# Patient Record
Sex: Female | Born: 1955 | Race: White | Hispanic: No | Marital: Married | Smoking: Never smoker
Health system: Southern US, Community
[De-identification: ages and names within clinical notes are randomized; demographics above are authoritative.]

## PROBLEM LIST (undated history)

## (undated) DIAGNOSIS — G43909 Migraine, unspecified, not intractable, without status migrainosus: Secondary | ICD-10-CM

## (undated) DIAGNOSIS — K219 Gastro-esophageal reflux disease without esophagitis: Secondary | ICD-10-CM

## (undated) HISTORY — DX: Gastro-esophageal reflux disease without esophagitis: K21.9

## (undated) HISTORY — PX: BREAST EXCISIONAL BIOPSY: SUR124

## (undated) HISTORY — DX: Migraine, unspecified, not intractable, without status migrainosus: G43.909

---

## 1991-06-17 HISTORY — PX: KNEE SURGERY: SHX244

## 1998-06-16 HISTORY — PX: LAMINECTOMY: SHX219

## 2013-07-26 ENCOUNTER — Other Ambulatory Visit: Payer: Self-pay | Admitting: *Deleted

## 2013-07-26 DIAGNOSIS — N649 Disorder of breast, unspecified: Secondary | ICD-10-CM

## 2013-08-03 ENCOUNTER — Other Ambulatory Visit: Payer: Self-pay | Admitting: *Deleted

## 2013-08-12 ENCOUNTER — Other Ambulatory Visit: Payer: Self-pay | Admitting: Unknown Physician Specialty

## 2013-08-12 ENCOUNTER — Other Ambulatory Visit: Payer: Self-pay | Admitting: *Deleted

## 2013-08-12 ENCOUNTER — Ambulatory Visit
Admission: RE | Admit: 2013-08-12 | Discharge: 2013-08-12 | Disposition: A | Discharge status: Home / Self Care | Payer: BC Managed Care – PPO | Source: Ambulatory Visit | Attending: *Deleted | Admitting: *Deleted

## 2013-08-12 DIAGNOSIS — N649 Disorder of breast, unspecified: Secondary | ICD-10-CM

## 2013-08-12 HISTORY — PX: BREAST BIOPSY: SHX20

## 2013-09-26 ENCOUNTER — Other Ambulatory Visit: Payer: Self-pay | Admitting: Unknown Physician Specialty

## 2013-09-26 DIAGNOSIS — N649 Disorder of breast, unspecified: Secondary | ICD-10-CM

## 2014-02-23 ENCOUNTER — Other Ambulatory Visit: Payer: Self-pay | Admitting: Unknown Physician Specialty

## 2014-02-23 DIAGNOSIS — N6310 Unspecified lump in the right breast, unspecified quadrant: Secondary | ICD-10-CM

## 2014-03-10 ENCOUNTER — Ambulatory Visit
Admission: RE | Admit: 2014-03-10 | Discharge: 2014-03-10 | Disposition: A | Discharge status: Home / Self Care | Payer: BC Managed Care – PPO | Source: Ambulatory Visit | Attending: Unknown Physician Specialty | Admitting: Unknown Physician Specialty

## 2014-03-10 DIAGNOSIS — N6310 Unspecified lump in the right breast, unspecified quadrant: Secondary | ICD-10-CM

## 2014-03-20 ENCOUNTER — Other Ambulatory Visit: Payer: BC Managed Care – PPO

## 2014-07-28 ENCOUNTER — Other Ambulatory Visit: Payer: Self-pay

## 2014-07-28 DIAGNOSIS — Z1231 Encounter for screening mammogram for malignant neoplasm of breast: Secondary | ICD-10-CM

## 2014-08-04 ENCOUNTER — Ambulatory Visit
Admission: RE | Admit: 2014-08-04 | Discharge: 2014-08-04 | Disposition: A | Discharge status: Home / Self Care | Payer: BLUE CROSS/BLUE SHIELD | Source: Ambulatory Visit

## 2014-08-04 DIAGNOSIS — Z1231 Encounter for screening mammogram for malignant neoplasm of breast: Secondary | ICD-10-CM

## 2015-01-29 IMAGING — MG MM DIAGNOSTIC UNILATERAL R
3 series · 3 of 3 positions shown · non-contrast
Comparison: Previous exams.

CLINICAL DATA: Right breast mass. Status post ultrasound-guided
core biopsy.

EXAM:
POST-BIOPSY CLIP PLACEMENT RIGHT DIAGNOSTIC MAMMOGRAM

[R CC (1 of 2)]
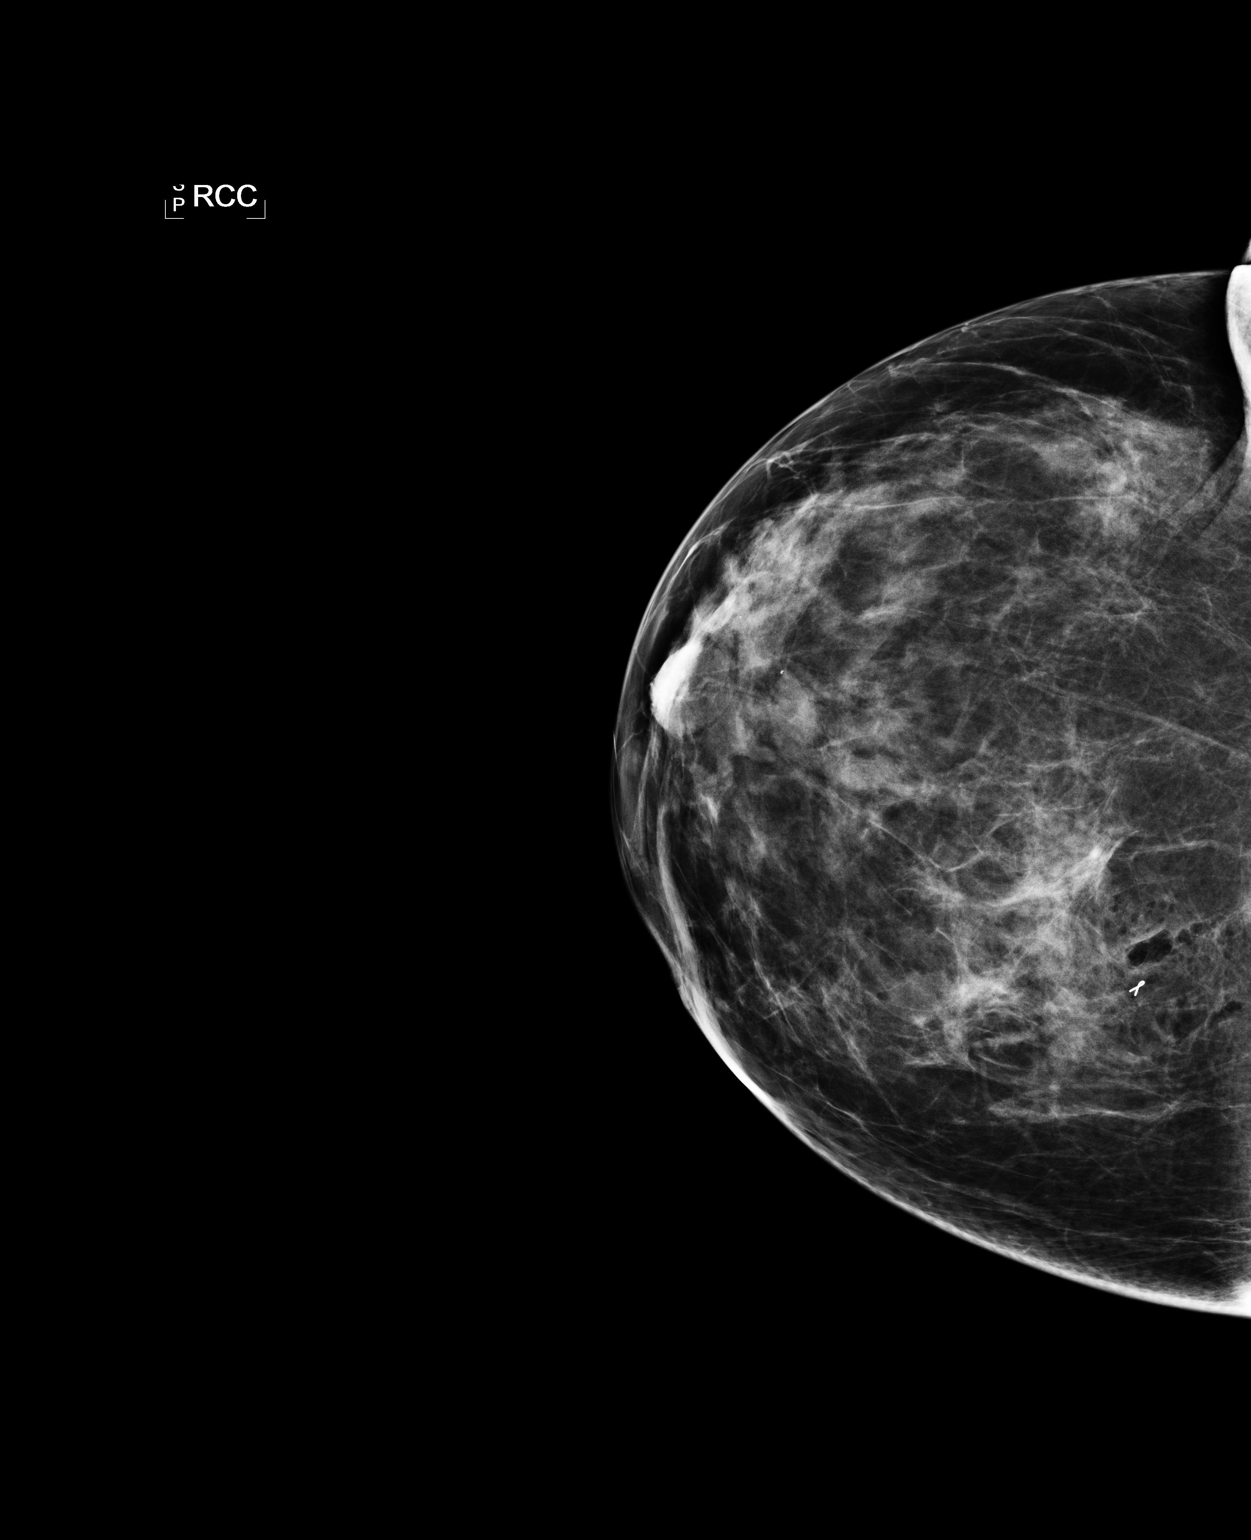

[R ML]
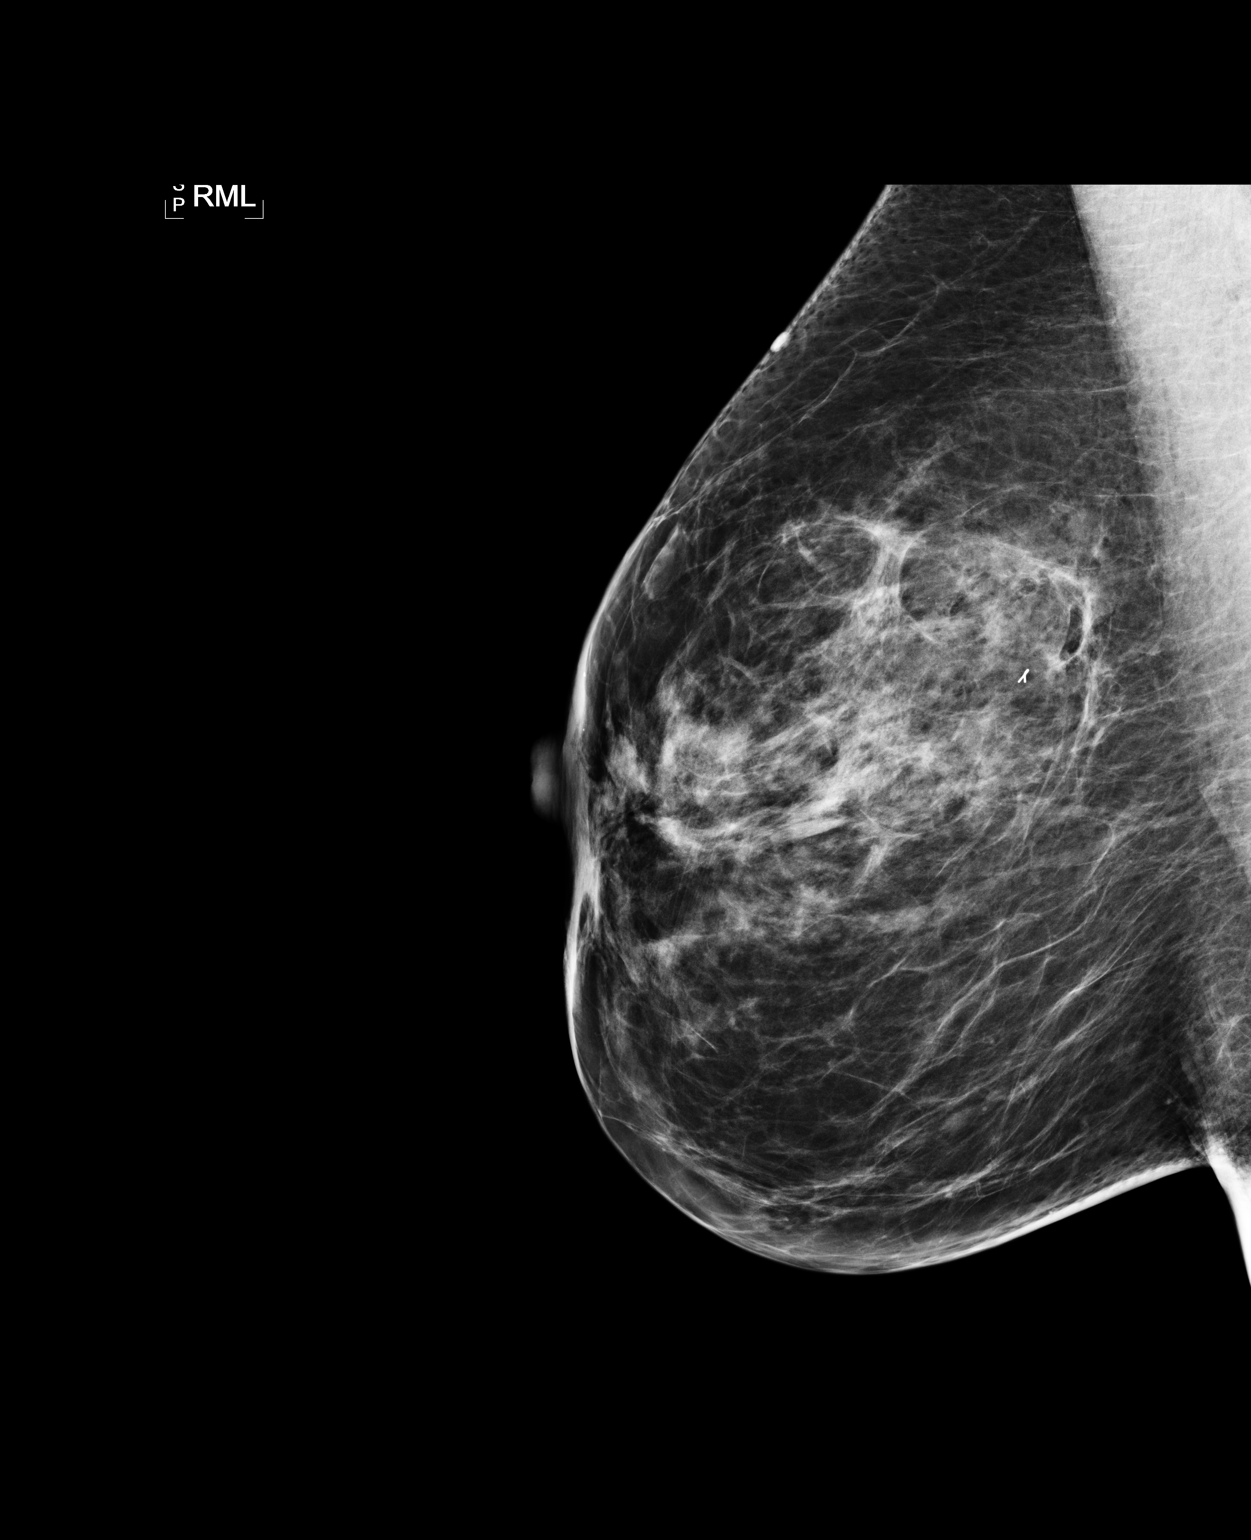

[R CC (2 of 2)]
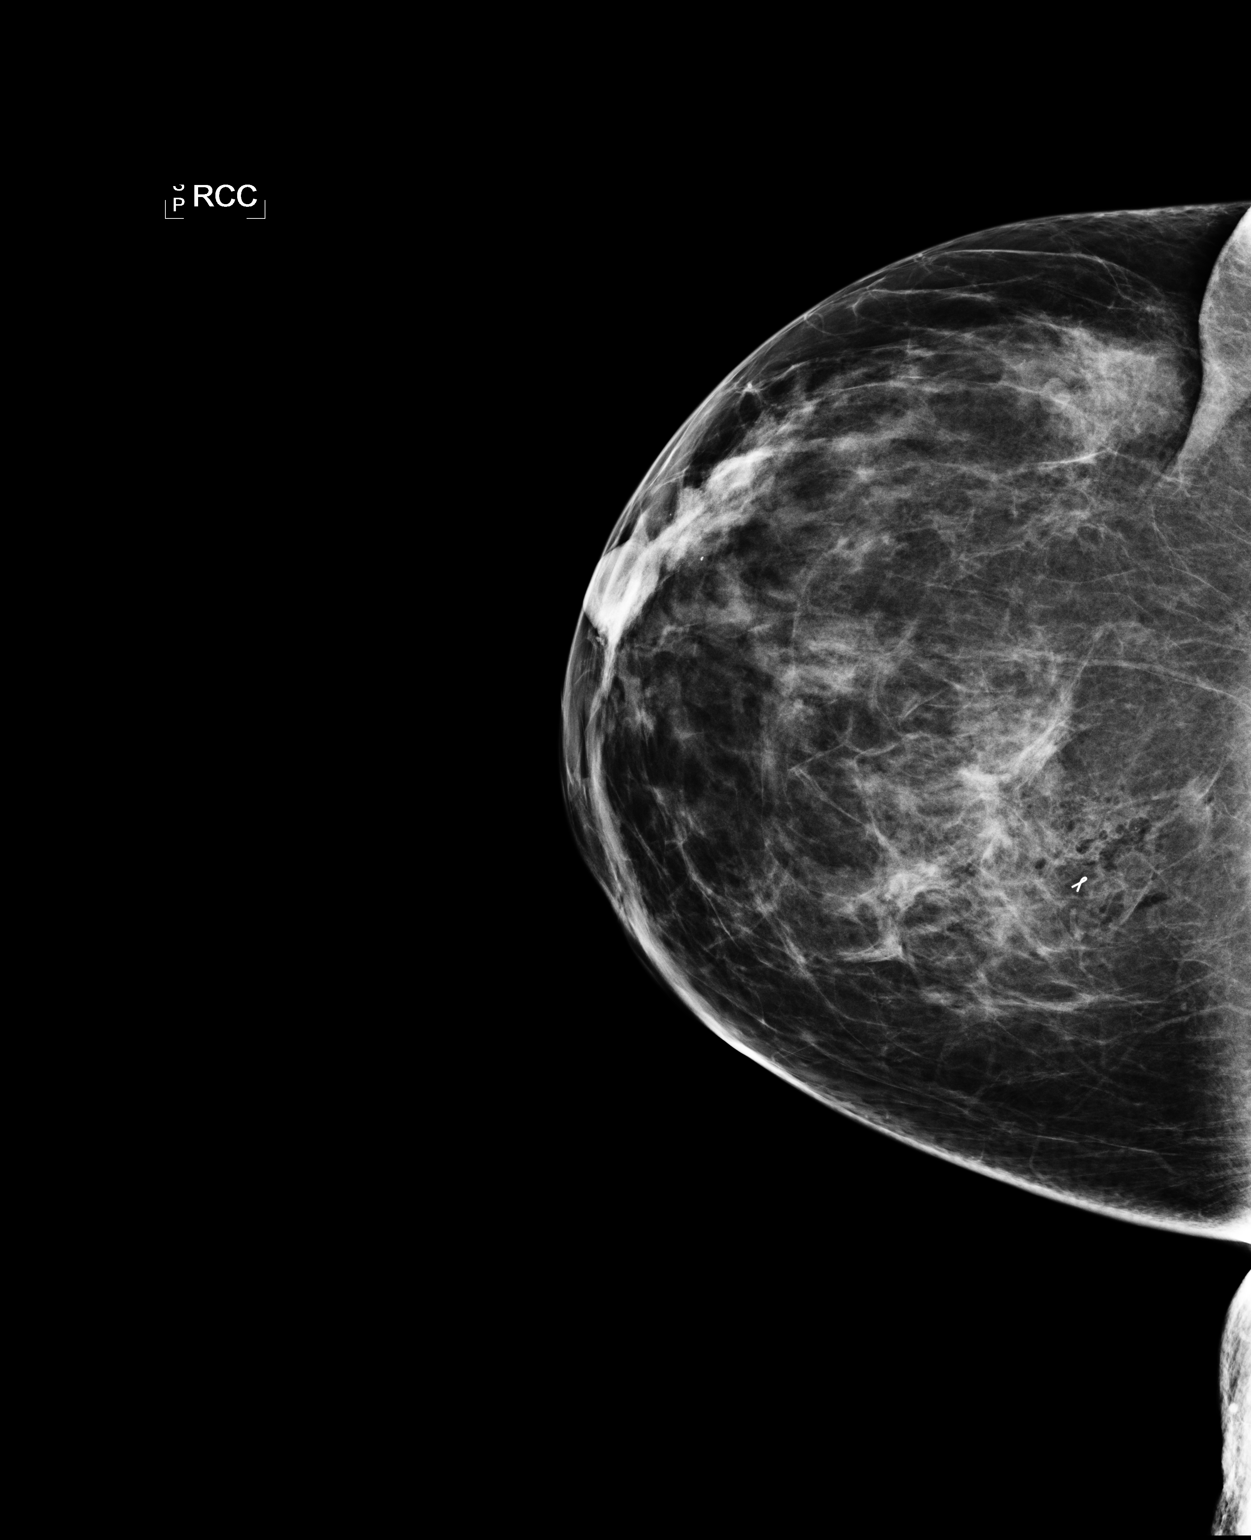

[3 of 3 positions shown; findings below may reference images not displayed]

FINDINGS: Films are performed following ultrasound guided biopsy of a mass in
the ![DATE] location of the right breast. Mammographic images showed
there is a ribbon shaped clip located approximately 2.5 cm anterior
medial to the biopsied lesion.
IMPRESSION: Status post ultrasound-guided core biopsy of right breast mass with
pathology pending.

Final Assessment: Post Procedure Mammograms for Marker Placement

## 2015-08-17 ENCOUNTER — Other Ambulatory Visit: Payer: Self-pay

## 2015-08-17 DIAGNOSIS — Z1231 Encounter for screening mammogram for malignant neoplasm of breast: Secondary | ICD-10-CM

## 2015-08-27 IMAGING — MG MM DIAGNOSTIC UNILATERAL R
4 series · 4 of 4 positions shown · non-contrast
Comparison: 07/19/2013 and earlier

CLINICAL DATA: The patient returns for six-month follow-up after
benign right breast biopsy.

EXAM:
DIGITAL DIAGNOSTIC  RIGHT MAMMOGRAM WITH CAD
ULTRASOUND RIGHT BREAST

[R CC (1 of 2)]
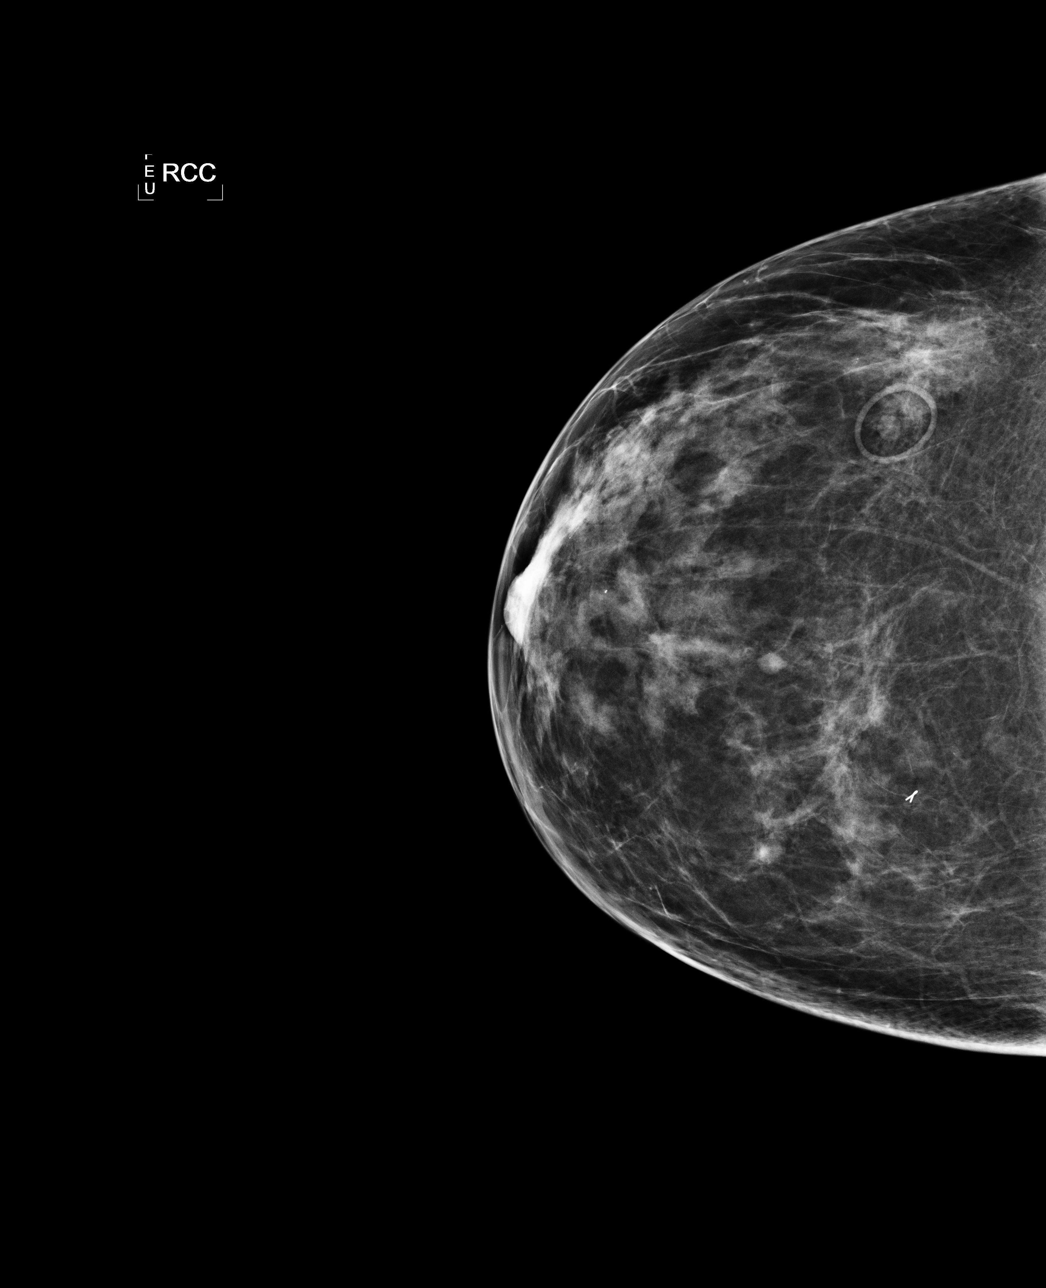

[R MLO]
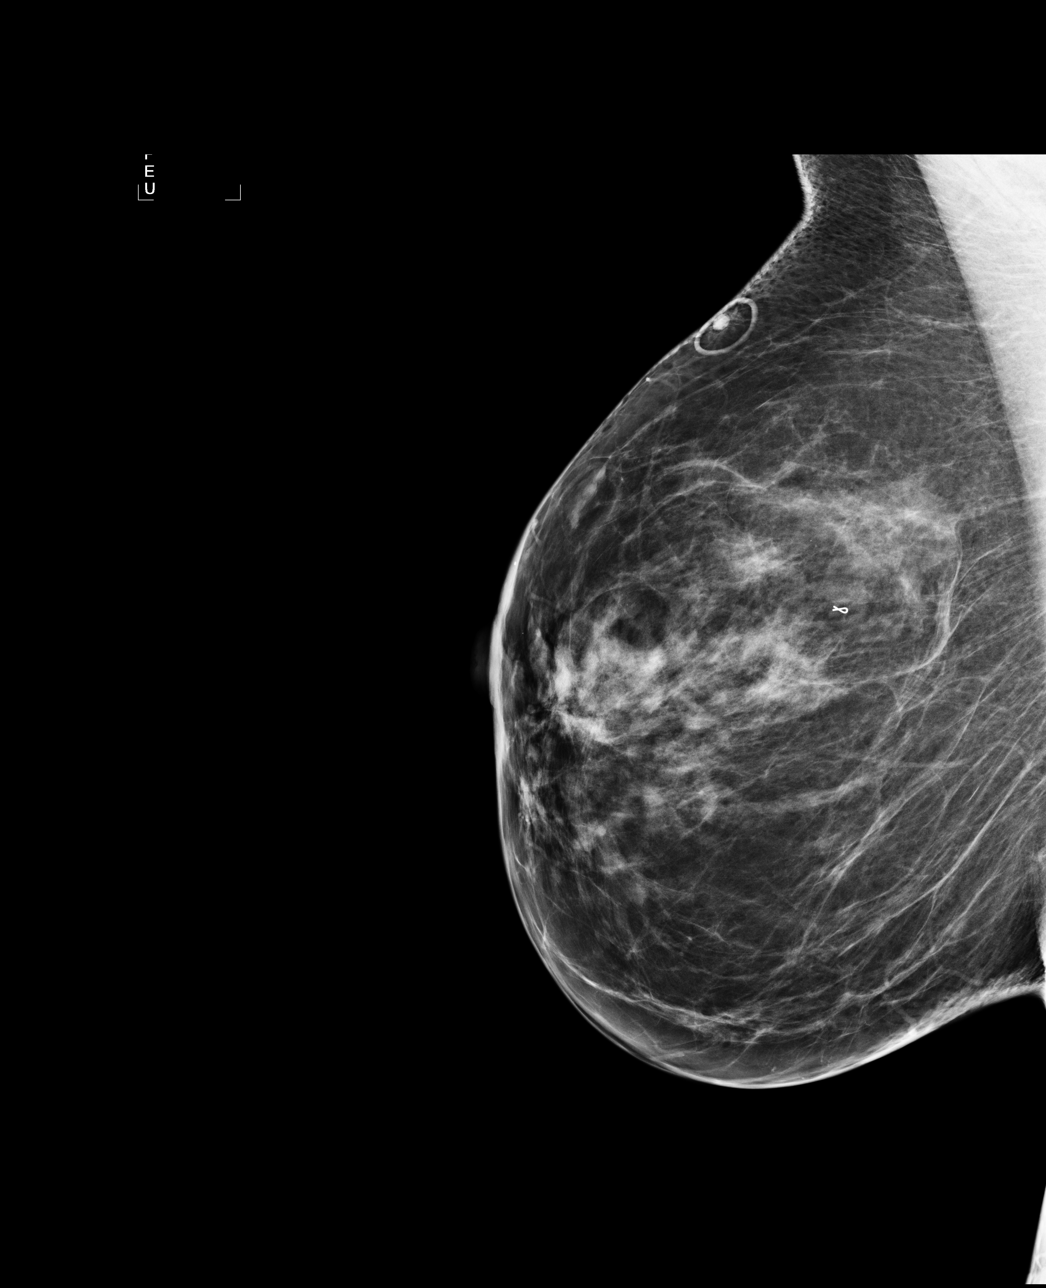

[R ML]
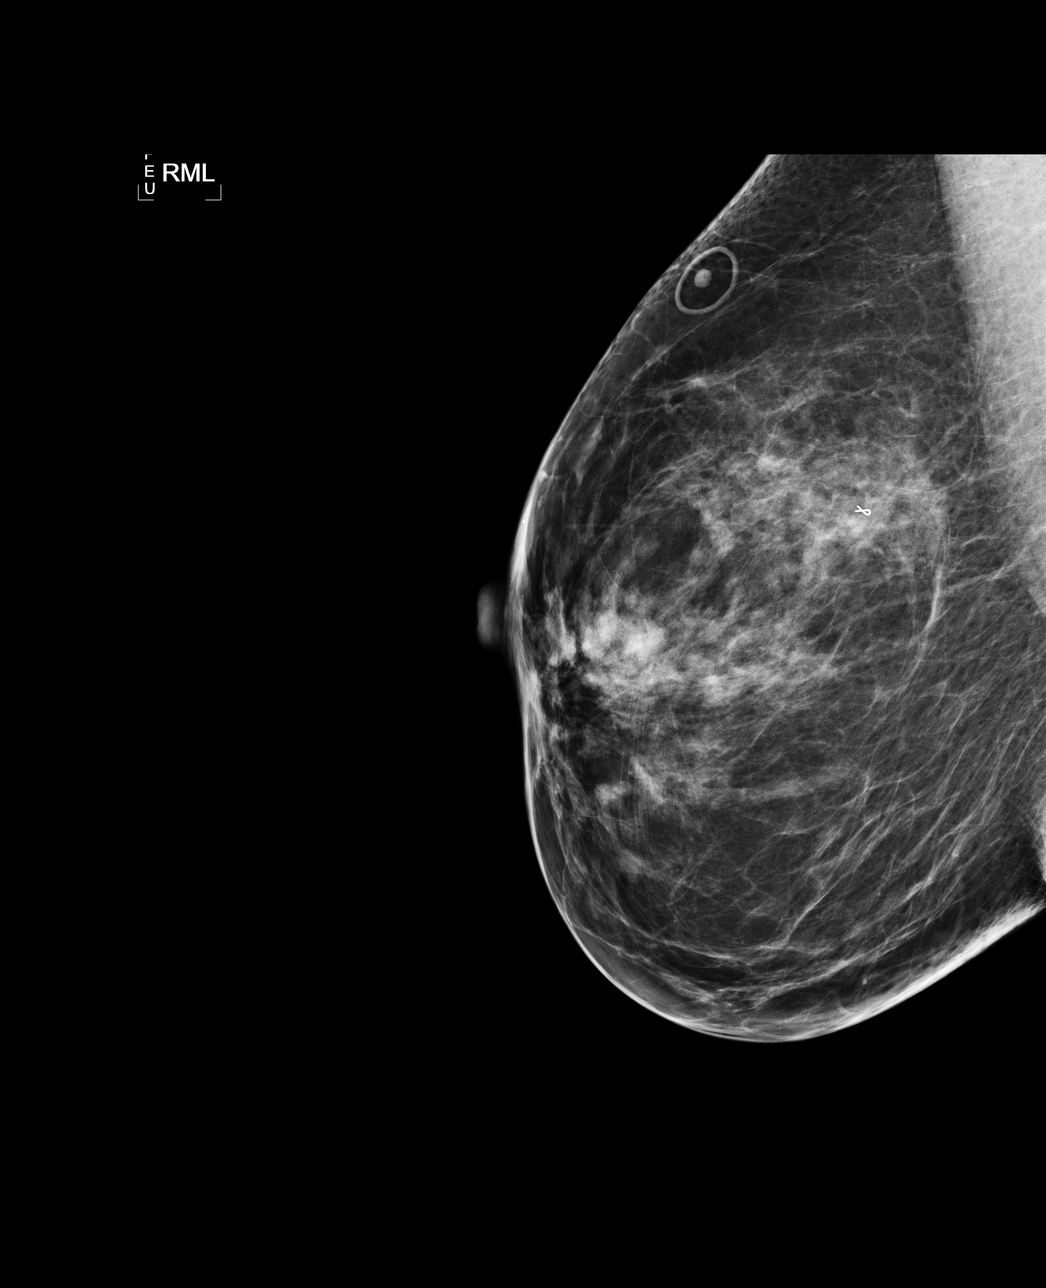

[R CC (2 of 2)]
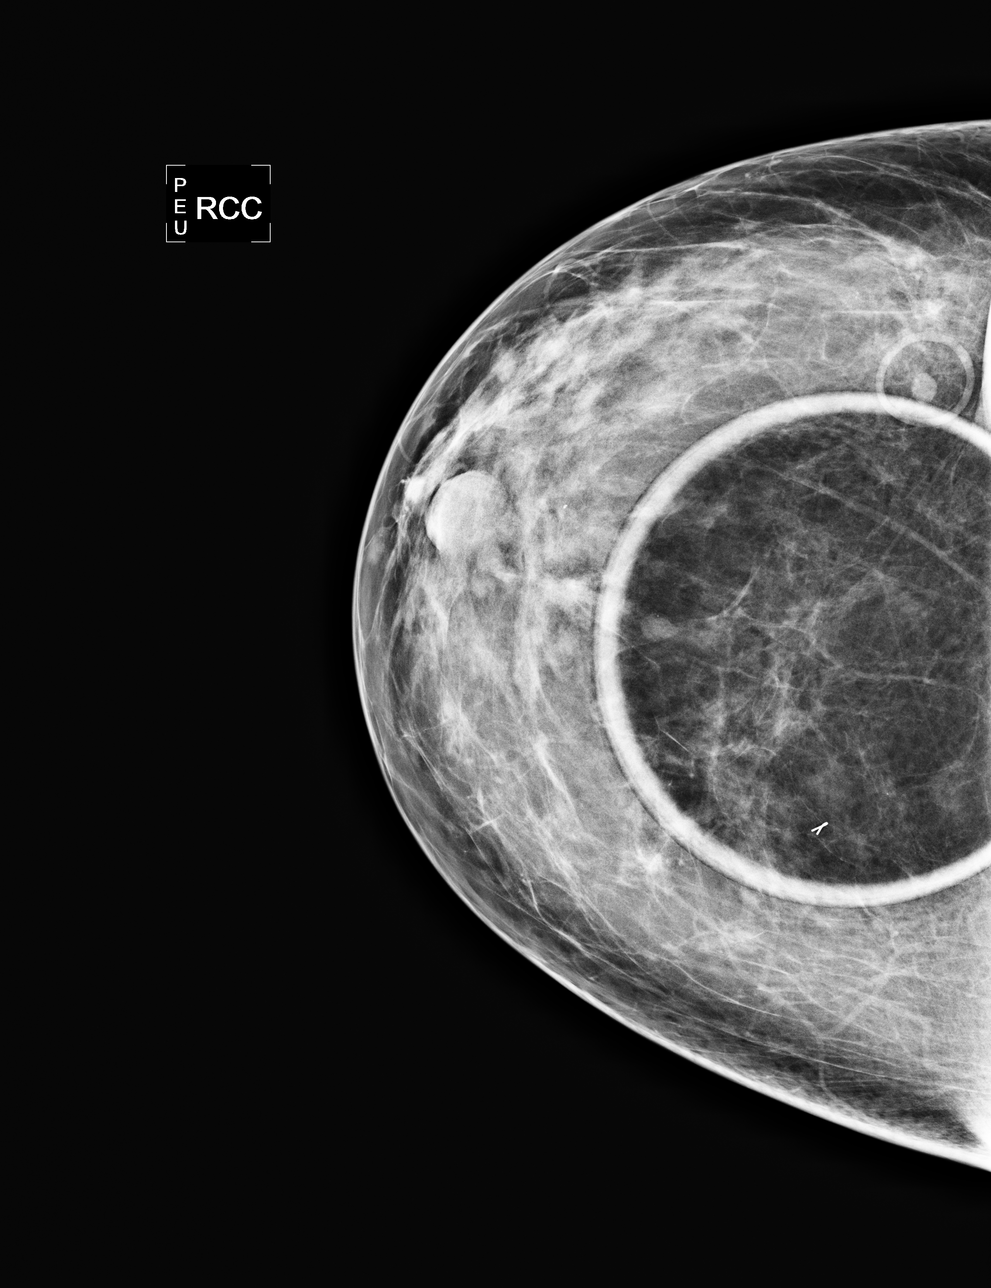

[4 of 4 positions shown; findings below may reference images not displayed]

ACR Breast Density Category c: The breast tissue is heterogeneously
dense, which may obscure small masses.
FINDINGS: Small nodule previously noted in the upper inner quadrant of the
right breast is no longer apparent following biopsy. A new oval
circumscribed nodule is identified in the central portion of the
right breast on CC view but not demonstrated on the oblique views.
Further evaluation is also performed with ultrasound.

Mammographic images were processed with CAD.

On physical exam, I palpate no abnormality in the central portions
of the right breast.

Ultrasound is performed, showing retroareolar duct ectasia. No
suspicious mass or distortion identified sonographically in the
upper or lower central portions of the right breast.
IMPRESSION: 1. Expected post biopsy changes in the right breast.
2. Retroareolar duct ectasia.

RECOMMENDATION:
Screening mammogram is recommended in July 2014.

I have discussed the findings and recommendations with the patient.
Results were also provided in writing at the conclusion of the
visit. If applicable, a reminder letter will be sent to the patient
regarding the next appointment.

BI-RADS CATEGORY  2: Benign.

## 2015-09-04 ENCOUNTER — Ambulatory Visit
Admission: RE | Admit: 2015-09-04 | Discharge: 2015-09-04 | Disposition: A | Discharge status: Home / Self Care | Payer: PRIVATE HEALTH INSURANCE | Source: Ambulatory Visit

## 2015-09-04 DIAGNOSIS — Z1231 Encounter for screening mammogram for malignant neoplasm of breast: Secondary | ICD-10-CM

## 2016-09-18 ENCOUNTER — Other Ambulatory Visit: Payer: Self-pay | Admitting: Unknown Physician Specialty

## 2016-09-18 DIAGNOSIS — Z1231 Encounter for screening mammogram for malignant neoplasm of breast: Secondary | ICD-10-CM

## 2016-10-28 ENCOUNTER — Ambulatory Visit
Admission: RE | Admit: 2016-10-28 | Discharge: 2016-10-28 | Disposition: A | Discharge status: Home / Self Care | Payer: PRIVATE HEALTH INSURANCE | Source: Ambulatory Visit | Attending: Unknown Physician Specialty | Admitting: Unknown Physician Specialty

## 2016-10-28 DIAGNOSIS — Z1231 Encounter for screening mammogram for malignant neoplasm of breast: Secondary | ICD-10-CM

## 2017-07-27 ENCOUNTER — Ambulatory Visit (INDEPENDENT_AMBULATORY_CARE_PROVIDER_SITE_OTHER): Payer: PRIVATE HEALTH INSURANCE | Admitting: Orthopaedic Surgery

## 2017-07-27 ENCOUNTER — Ambulatory Visit (INDEPENDENT_AMBULATORY_CARE_PROVIDER_SITE_OTHER): Payer: PRIVATE HEALTH INSURANCE

## 2017-07-27 ENCOUNTER — Encounter (INDEPENDENT_AMBULATORY_CARE_PROVIDER_SITE_OTHER): Payer: Self-pay | Admitting: Orthopaedic Surgery

## 2017-07-27 VITALS — Ht 63.0 in | Wt 150.0 lb

## 2017-07-27 DIAGNOSIS — G8929 Other chronic pain: Secondary | ICD-10-CM | POA: Diagnosis not present

## 2017-07-27 DIAGNOSIS — M545 Low back pain: Secondary | ICD-10-CM

## 2017-07-27 DIAGNOSIS — M25551 Pain in right hip: Secondary | ICD-10-CM | POA: Diagnosis not present

## 2017-07-27 MED ORDER — METHYLPREDNISOLONE ACETATE 40 MG/ML IJ SUSP
40.0000 mg | INTRAMUSCULAR | Status: AC | PRN
  Administered 2017-07-27: 40 mg via INTRA_ARTICULAR

## 2017-07-27 MED ORDER — BUPIVACAINE HCL 0.5 % IJ SOLN
3.0000 mL | INTRAMUSCULAR | Status: AC | PRN
  Administered 2017-07-27: 3 mL via INTRA_ARTICULAR

## 2017-07-27 MED ORDER — LIDOCAINE HCL 1 % IJ SOLN
3.0000 mL | INTRAMUSCULAR | Status: AC | PRN
  Administered 2017-07-27: 3 mL

## 2017-07-27 NOTE — Progress Notes (Signed)
Office Visit Note   Patient: Jenna GordonRobin Mahnken           Date of Birth: 07/16/1955           MRN: [id number] Visit Date: 07/27/2017              Requested by: No referring provider defined for this encounter. PCP: Romeo Rabonaplan, Michael, MD   Assessment & Plan: Visit Diagnoses:  1. Chronic right-sided low back pain, with sciatica presence unspecified   2. Pain in right hip     Plan: Impression is 62 year old female with right lateral hip pain.  Trochanteric bursitis injection was performed today.  Patient tolerates well.  IT band stretches were given today.  If she is not better may need to consider MRI to rule out structural abnormalities.  Otherwise follow-up as needed.  Questions encouraged and answered.  Follow-Up Instructions: Return if symptoms worsen or fail to improve.   Orders:  Orders Placed This Encounter  Procedures  . XR HIP UNILAT W OR W/O PELVIS 2-3 VIEWS RIGHT  . XR Lumbar Spine 2-3 Views   No orders of the defined types were placed in this encounter.     Procedures: Large Joint Inj: R greater trochanter on 07/27/2017 4:47 PM Indications: pain Details: 22 G needle  Arthrogram: No  Medications: 3 mL lidocaine 1 %; 3 mL bupivacaine 0.5 %; 40 mg methylPREDNISolone acetate 40 MG/ML Patient was prepped and draped in the usual sterile fashion.       Clinical Data: No additional findings.   Subjective: Chief Complaint  Patient presents with  . Lower Back - Pain  . Right Hip - Pain    Patient is a very pleasant 62 year old female comes in with right hip pain in the posterior lateral region.  Denies any groin pain.  Denies any radicular symptoms.  She is status post laminectomy about 20 years ago.  She denies any back pain.  Denies any recent injuries.    Review of Systems  Constitutional: Negative.   HENT: Negative.   Eyes: Negative.   Respiratory: Negative.   Cardiovascular: Negative.   Endocrine: Negative.   Musculoskeletal: Negative.     Neurological: Negative.   Hematological: Negative.   Psychiatric/Behavioral: Negative.   All other systems reviewed and are negative.    Objective: Vital Signs: Ht 5\' 3"  (1.6 m)   Wt 150 lb (68 kg)   BMI 26.57 kg/m   Physical Exam  Constitutional: She is oriented to person, place, and time. She appears well-developed and well-nourished.  HENT:  Head: Normocephalic and atraumatic.  Eyes: EOM are normal.  Neck: Neck supple.  Pulmonary/Chest: Effort normal.  Abdominal: Soft.  Neurological: She is alert and oriented to person, place, and time.  Skin: Skin is warm. Capillary refill takes less than 2 seconds.  Psychiatric: She has a normal mood and affect. Her behavior is normal. Judgment and thought content normal.  Nursing note and vitals reviewed.   Ortho Exam Right hip exam shows painless rotation and negative Stinchfield test.  Greater trochanter and trochanteric bursa are both tender to palpation.  No significant pain with hip abduction. Specialty Comments:  No specialty comments available.  Imaging: Xr Hip Unilat W Or W/o Pelvis 2-3 Views Right  Result Date: 07/27/2017 No acute or structural abnormalities  Xr Lumbar Spine 2-3 Views  Result Date: 07/27/2017 Moderate to severe degenerative disc disease at L4-L5 and L5-S1    PMFS History: There are no active problems to display for this patient.  Past Medical History:  Diagnosis Date  . Acid reflux   . Migraines     No family history on file.  Past Surgical History:  Procedure Laterality Date  . BREAST BIOPSY Right 08/12/2013  . KNEE SURGERY Left 1993   ACL  . LAMINECTOMY  2000   L4-5,L5-S1   Social History   Occupational History  . Not on file  Tobacco Use  . Smoking status: Never Smoker  . Smokeless tobacco: Never Used  Substance and Sexual Activity  . Alcohol use: No    Frequency: Never  . Drug use: No  . Sexual activity: Not on file

## 2017-09-24 ENCOUNTER — Other Ambulatory Visit: Payer: Self-pay | Admitting: Gynecology

## 2017-09-24 DIAGNOSIS — Z1231 Encounter for screening mammogram for malignant neoplasm of breast: Secondary | ICD-10-CM

## 2017-10-30 ENCOUNTER — Ambulatory Visit
Admission: RE | Admit: 2017-10-30 | Discharge: 2017-10-30 | Disposition: A | Discharge status: Home / Self Care | Payer: PRIVATE HEALTH INSURANCE | Source: Ambulatory Visit | Attending: Gynecology | Admitting: Gynecology

## 2017-10-30 DIAGNOSIS — Z1231 Encounter for screening mammogram for malignant neoplasm of breast: Secondary | ICD-10-CM

## 2018-09-21 ENCOUNTER — Other Ambulatory Visit: Payer: Self-pay | Admitting: Gynecology

## 2018-09-21 DIAGNOSIS — Z1231 Encounter for screening mammogram for malignant neoplasm of breast: Secondary | ICD-10-CM

## 2018-11-19 ENCOUNTER — Other Ambulatory Visit: Payer: Self-pay

## 2018-11-19 ENCOUNTER — Ambulatory Visit
Admission: RE | Admit: 2018-11-19 | Discharge: 2018-11-19 | Disposition: A | Discharge status: Home / Self Care | Payer: PRIVATE HEALTH INSURANCE | Source: Ambulatory Visit | Attending: Gynecology | Admitting: Gynecology

## 2018-11-19 DIAGNOSIS — Z1231 Encounter for screening mammogram for malignant neoplasm of breast: Secondary | ICD-10-CM

## 2019-11-09 ENCOUNTER — Other Ambulatory Visit: Payer: Self-pay | Admitting: Gynecology

## 2019-11-09 DIAGNOSIS — Z1231 Encounter for screening mammogram for malignant neoplasm of breast: Secondary | ICD-10-CM

## 2019-12-12 ENCOUNTER — Ambulatory Visit: Payer: PRIVATE HEALTH INSURANCE

## 2019-12-20 ENCOUNTER — Ambulatory Visit
Admission: RE | Admit: 2019-12-20 | Discharge: 2019-12-20 | Disposition: A | Discharge status: Home / Self Care | Payer: BLUE CROSS/BLUE SHIELD | Source: Ambulatory Visit | Attending: Gynecology | Admitting: Gynecology

## 2019-12-20 ENCOUNTER — Other Ambulatory Visit: Payer: Self-pay

## 2019-12-20 DIAGNOSIS — Z1231 Encounter for screening mammogram for malignant neoplasm of breast: Secondary | ICD-10-CM

## 2020-11-20 ENCOUNTER — Other Ambulatory Visit: Payer: Self-pay | Admitting: Gynecology

## 2020-11-20 DIAGNOSIS — Z1231 Encounter for screening mammogram for malignant neoplasm of breast: Secondary | ICD-10-CM

## 2021-01-14 ENCOUNTER — Other Ambulatory Visit: Payer: Self-pay

## 2021-01-14 ENCOUNTER — Ambulatory Visit
Admission: RE | Admit: 2021-01-14 | Discharge: 2021-01-14 | Disposition: A | Discharge status: Home / Self Care | Payer: Medicare Other | Source: Ambulatory Visit | Attending: Gynecology | Admitting: Gynecology

## 2021-01-14 DIAGNOSIS — Z1231 Encounter for screening mammogram for malignant neoplasm of breast: Secondary | ICD-10-CM

## 2021-05-23 ENCOUNTER — Ambulatory Visit (INDEPENDENT_AMBULATORY_CARE_PROVIDER_SITE_OTHER): Payer: Medicare Other

## 2021-05-23 ENCOUNTER — Ambulatory Visit (INDEPENDENT_AMBULATORY_CARE_PROVIDER_SITE_OTHER): Payer: Medicare Other | Admitting: Orthopaedic Surgery

## 2021-05-23 ENCOUNTER — Other Ambulatory Visit: Payer: Self-pay

## 2021-05-23 DIAGNOSIS — M7062 Trochanteric bursitis, left hip: Secondary | ICD-10-CM

## 2021-05-23 NOTE — Progress Notes (Signed)
Office Visit Note   Patient: Jenna Day           Date of Birth: 04-Sep-1955           MRN: [id number] Visit Date: 05/23/2021              Requested by: Romeo Rabon, MD [address] [place],  [place] [address] PCP: Romeo Rabon, MD   Assessment & Plan: Visit Diagnoses:  1. Trochanteric bursitis of left hip     Plan: Impression is left hip trochanteric bursitis.  Today, we discussed proceeding with cortisone injection followed by an IT band stretching program.  I am hopeful that this will significantly help as this appears to be very similar to what she experienced on the right.  She will follow-up with [place] as needed.  Follow-Up Instructions: Return if symptoms worsen or fail to improve.   Orders:  Orders Placed This Encounter  Procedures   Large Joint Inj: L greater trochanter   XR HIP UNILAT W OR W/O PELVIS 2-3 VIEWS LEFT   No orders of the defined types were placed in this encounter.     Procedures: Large Joint Inj: L greater trochanter on 05/23/2021 8:38 AM Indications: pain Details: 22 G needle, lateral approach Medications: 3 mL lidocaine 1 %; 40 mg methylPREDNISolone acetate 40 MG/ML; 2 mL bupivacaine 0.25 %     Clinical Data: No additional findings.   Subjective: Chief Complaint  Patient presents with   Left Hip - Pain    HPI patient is a pleasant 65 year old female who comes in today with left lateral hip pain for the past few months.  She denies any injury but does note that she started keeping her grandchild in August where she is actively playing and caring her around.  She denies any pain that radiates to the groin, anterior thigh, back or down the leg.  The pain is worse with the first few steps of walking after she has been sitting or lying down.  She denies any paresthesias to the left lower extremity.  Of note, she was seen in our office in 2019 for very similar symptoms but on the right side.  These also began after several  months of keeping her first grandbaby and carrying her around.  Trochanteric bursa injection followed by IT band stretches resolved her symptoms.  Review of Systems as detailed in HPI.  All others reviewed and are negative.   Objective: Vital Signs: There were no vitals taken for this visit.  Physical Exam well-developed and well-nourished female in no acute distress.  Alert and oriented x3.  Ortho Exam examination of the left hip reveals moderate tenderness along the greater trochanter.  Negative logroll negative straight leg raise.  She is neurovascular intact distally.  Specialty Comments:  No specialty comments available.  Imaging: XR HIP UNILAT W OR W/O PELVIS 2-3 VIEWS LEFT  Result Date: 05/23/2021 X-rays demonstrate mild bilateral hip degenerative changes    PMFS History: There are no problems to display for this patient.  Past Medical History:  Diagnosis Date   Acid reflux    Migraines     No family history on file.  Past Surgical History:  Procedure Laterality Date   BREAST BIOPSY Right 08/12/2013   BREAST EXCISIONAL BIOPSY Right    KNEE SURGERY Left 1993   ACL   LAMINECTOMY  2000   L4-5,L5-S1   Social History   Occupational History   Not on file  Tobacco Use  Smoking status: Never   Smokeless tobacco: Never  Substance and Sexual Activity   Alcohol use: No   Drug use: No   Sexual activity: Not on file

## 2021-05-24 MED ORDER — LIDOCAINE HCL 1 % IJ SOLN
3.0000 mL | INTRAMUSCULAR | Status: AC | PRN
  Administered 2021-05-23: 3 mL

## 2021-05-24 MED ORDER — BUPIVACAINE HCL 0.25 % IJ SOLN
2.0000 mL | INTRAMUSCULAR | Status: AC | PRN
  Administered 2021-05-23: 2 mL via INTRA_ARTICULAR

## 2021-05-24 MED ORDER — METHYLPREDNISOLONE ACETATE 40 MG/ML IJ SUSP
40.0000 mg | INTRAMUSCULAR | Status: AC | PRN
  Administered 2021-05-23: 40 mg via INTRA_ARTICULAR

## 2021-07-02 ENCOUNTER — Other Ambulatory Visit: Payer: Self-pay

## 2021-07-02 ENCOUNTER — Encounter: Payer: Self-pay | Admitting: Orthopaedic Surgery

## 2021-07-02 ENCOUNTER — Ambulatory Visit (INDEPENDENT_AMBULATORY_CARE_PROVIDER_SITE_OTHER): Payer: Medicare Other | Admitting: Orthopaedic Surgery

## 2021-07-02 DIAGNOSIS — M25552 Pain in left hip: Secondary | ICD-10-CM

## 2021-07-02 MED ORDER — PREDNISONE 10 MG (21) PO TBPK: ORAL_TABLET | ORAL | 3 refills | Status: DC

## 2021-07-02 NOTE — Progress Notes (Signed)
° °  Office Visit Note   Patient: Jenna Day           Date of Birth: 1955-07-08           MRN: [id number] Visit Date: 07/02/2021              Requested by: Romeo Rabon, MD [address] [place],  [place] [address] PCP: Romeo Rabon, MD   Assessment & Plan: Visit Diagnoses:  1. Pain in left hip     Plan: [name] comes in today for ongoing left hip pain.  The trochanteric cortisone injection performed in early December did not help to significant degree.  She sometimes has some groin pain but for the most part the pain is posterior lateral hip that is worse with external rotation and walking.  Denies any true lumbar radiculopathy.  Left hip exam shows normal range of motion without any groin pain.  She does have reproducible pain to the posterior lateral aspect of the greater trochanter with FABER test and with palpation.  No sciatic tension signs.  I reviewed her x-rays again which show well-preserved joint spaces.  Not getting the sense that is coming from her back.  Feel that this has to do with the short external rotators or piriformis.  Based on her options she would like to try a course of outpatient PT and prednisone Dosepak.  She will follow-up if there is no improvement.  Follow-Up Instructions: No follow-ups on file.   Orders:  No orders of the defined types were placed in this encounter.  Meds ordered this encounter  Medications   predniSONE (STERAPRED UNI-PAK 21 TAB) 10 MG (21) TBPK tablet    Sig: Take as directed    Dispense:  21 tablet    Refill:  3      Procedures: No procedures performed   Clinical Data: No additional findings.   Subjective: Chief Complaint  Patient presents with   Left Hip - Follow-up, Pain   Lower Back - Pain    HPI  Review of Systems   Objective: Vital Signs: There were no vitals taken for this visit.  Physical Exam  Ortho Exam  Specialty Comments:  No specialty comments available.  Imaging: No  results found.   PMFS History: Patient Active Problem List   Diagnosis Date Noted   Pain in left hip 07/02/2021   Past Medical History:  Diagnosis Date   Acid reflux    Migraines     History reviewed. No pertinent family history.  Past Surgical History:  Procedure Laterality Date   BREAST BIOPSY Right 08/12/2013   BREAST EXCISIONAL BIOPSY Right    KNEE SURGERY Left 1993   ACL   LAMINECTOMY  2000   L4-5,L5-S1   Social History   Occupational History   Not on file  Tobacco Use   Smoking status: Never   Smokeless tobacco: Never  Substance and Sexual Activity   Alcohol use: No   Drug use: No   Sexual activity: Not on file

## 2021-09-03 ENCOUNTER — Encounter: Payer: Self-pay | Admitting: Orthopaedic Surgery

## 2021-09-03 ENCOUNTER — Ambulatory Visit (INDEPENDENT_AMBULATORY_CARE_PROVIDER_SITE_OTHER): Payer: Medicare Other | Admitting: Orthopaedic Surgery

## 2021-09-03 ENCOUNTER — Other Ambulatory Visit: Payer: Self-pay

## 2021-09-03 DIAGNOSIS — M25552 Pain in left hip: Secondary | ICD-10-CM | POA: Diagnosis not present

## 2021-09-03 NOTE — Progress Notes (Signed)
? ?  Office Visit Note ?  ?Patient: Jenna Day           ?Date of Birth: Nov 06, 1955           ?MRN: [id number] ?Visit Date: 09/03/2021 ?             ?Requested by: Moshe Cipro, MD ?[address] ?[address],  [place] [address] ?PCP: Moshe Cipro, MD ? ? ?Assessment & Plan: ?Visit Diagnoses:  ?1. Pain in left hip   ? ? ?Plan: [name] returns today for continued left hip pain.  She has done 4 weeks of physical therapy and a prednisone Dosepak with some slight improvement but the pain continues to bother her on the lateral side that is worse with getting up from a seated position and using steps.  Motrin does help with the pain. ? ?At this point with ongoing symptoms despite conservative management we will need an MRI of the left hip to rule out structural abnormalities.  I suspect that she has abductor tendinopathy but I want to make sure that she does not have a full-thickness tear.  I will communicate with the patient after the MRI. ? ?Follow-Up Instructions: No follow-ups on file.  ? ?Orders:  ?Orders Placed This Encounter  ?Procedures  ? MR Hip Left w/o contrast  ? ?No orders of the defined types were placed in this encounter. ? ? ? ? Procedures: ?No procedures performed ? ? ?Clinical Data: ?No additional findings. ? ? ?Subjective: ?Chief Complaint  ?Patient presents with  ? Left Hip - Pain  ? ? ?HPI ? ?Review of Systems ? ? ?Objective: ?Vital Signs: There were no vitals taken for this visit. ? ?Physical Exam ? ?Ortho Exam ? ?Specialty Comments:  ?No specialty comments available. ? ?Imaging: ?No results found. ? ? ?PMFS History: ?Patient Active Problem List  ? Diagnosis Date Noted  ? Pain in left hip 07/02/2021  ? ?Past Medical History:  ?Diagnosis Date  ? Acid reflux   ? Migraines   ?  ?No family history on file.  ?Past Surgical History:  ?Procedure Laterality Date  ? BREAST BIOPSY Right 08/12/2013  ? BREAST EXCISIONAL BIOPSY Right   ? KNEE SURGERY Left 1993  ? ACL  ? LAMINECTOMY  2000  ? L4-5,L5-S1   ? ?Social History  ? ?Occupational History  ? Not on file  ?Tobacco Use  ? Smoking status: Never  ? Smokeless tobacco: Never  ?Substance and Sexual Activity  ? Alcohol use: No  ? Drug use: No  ? Sexual activity: Not on file  ? ? ? ? ? ? ?

## 2021-09-10 ENCOUNTER — Ambulatory Visit
Admission: RE | Admit: 2021-09-10 | Discharge: 2021-09-10 | Disposition: A | Discharge status: Home / Self Care | Payer: Medicare Other | Source: Ambulatory Visit | Attending: Orthopaedic Surgery | Admitting: Orthopaedic Surgery

## 2021-09-10 DIAGNOSIS — M25552 Pain in left hip: Secondary | ICD-10-CM

## 2021-09-16 ENCOUNTER — Encounter: Payer: Self-pay | Admitting: Orthopaedic Surgery

## 2021-09-16 DIAGNOSIS — M7062 Trochanteric bursitis, left hip: Secondary | ICD-10-CM

## 2021-09-17 NOTE — Telephone Encounter (Signed)
Please refer to Dr. Denyse Amass for ultrasound guided troch bursa injection.  Thanks.

## 2021-09-23 ENCOUNTER — Ambulatory Visit: Payer: Medicare Other | Admitting: Physician Assistant

## 2021-09-24 ENCOUNTER — Ambulatory Visit: Payer: Self-pay

## 2021-09-24 ENCOUNTER — Ambulatory Visit (INDEPENDENT_AMBULATORY_CARE_PROVIDER_SITE_OTHER): Payer: Medicare Other | Admitting: Family Medicine

## 2021-09-24 VITALS — BP 128/84 | HR 72 | Ht 63.0 in | Wt 149.8 lb

## 2021-09-24 DIAGNOSIS — M25552 Pain in left hip: Secondary | ICD-10-CM

## 2021-09-24 NOTE — Patient Instructions (Addendum)
Thank you for coming in today.  ? ?You received an injection today. Seek immediate medical attention if the joint becomes red, extremely painful, or is oozing fluid.  ? ?Let us know how you are feeling ? ?Recheck back as needed ? ? ?

## 2021-09-24 NOTE — Progress Notes (Signed)
? ? ?Subjective:   ? ?CC: L hip pain ? ?I, Jenna Day, LAT, ATC, am serving as scribe for Dr. Clementeen Graham. ? ?HPI: Pt is a 66 y/o female c/o L hip pain since Oct 2022.  She has been seen by Dr. Roda Day and was referred to PT of which she has completed approximately 6 weeks of PT and was prescribed a prednisone dosepack.  She also had an MRI ordered that was completed on 09/10/21.  Today, pt reports having a GT steroid injection Nov.  She locates her pain to posterior-lateral aspect of the L hip. ? ?Radiating pain: yes- sometimes along the lateral thigh ?L hip mechanical symptoms: no ?Aggravating factors: sitting in figure-4 position, first step after sitting, IR/ER ?Treatments tried: Motrin, prednisone dose pack, PT ? ?Diagnostic testing: L hip MRI- 09/10/21; L hip XR- 05/23/21 ? ?Pertinent review of Systems: No fevers or chills ? ?Relevant historical information: Otherwise healthy ? ? ?Objective:   ? ?Vitals:  ? 09/24/21 0832  ?BP: 128/84  ?Pulse: 72  ?SpO2: 98%  ? ?General: Well Developed, well nourished, and in no acute distress.  ? ?MSK: Left hip normal-appearing ?Tender palpation mildly at posterior aspect of greater trochanter. ?Pain with hip abduction and rotation. ?Mild antalgic gait. ? ?Lab and Radiology Results ? ?Procedure: Real-time Ultrasound Guided Injection of left hip greater trochanter bursa at gluteus medius insertion ?Device: Philips Affiniti 50G ?Images permanently stored and available for review in PACS ?Verbal informed consent obtained.  Discussed risks and benefits of procedure. Warned about infection, bleeding, hyperglycemia damage to structures among others. ?Patient expresses understanding and agreement ?Time-out conducted.   ?Noted no overlying erythema, induration, or other signs of local infection.   ?Skin prepped in a sterile fashion.   ?Local anesthesia: Topical Ethyl chloride.   ?With sterile technique and under real time ultrasound guidance: 40 mg of Kenalog and 2 mL of Marcaine  injected into greater trochanter bursa. Fluid seen entering the bursa.   ?Completed without difficulty   ?Pain moderately to significantly resolved suggesting accurate placement of the medication.   ?Advised to call if fevers/chills, erythema, induration, drainage, or persistent bleeding.   ?Images permanently stored and available for review in the ultrasound unit.  ?Impression: Technically successful ultrasound guided injection. ? ? ?EXAM: ?MR OF THE LEFT HIP WITHOUT CONTRAST ?  ?TECHNIQUE: ?Multiplanar, multisequence MR imaging was performed. No intravenous ?contrast was administered. ?  ?COMPARISON:  Pelvis and left hip radiographs 05/23/2021 ?  ?FINDINGS: ?Bones: No acute fracture or avascular necrosis is seen within the ?visualized portions of the pelvis or either proximal femur. 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 ?  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 There is similar tiny signal abnormality within the ?anterior superior right femoral head-neck junction raising the ?question of bilateral enchondromas versus tiny degenerative ?subchondral cysts. ?  ?Left hip: ?  ?Articular cartilage and labrum ?  ?Articular cartilage: There is mild to moderate anterior superior ?left femoral head and acetabular cartilage thinning. ?  ?Labrum: There is linear fluid bright signal extending into the ?anterior superior chondrolabral junction (oblique axial series 14, ?image 11 and sagittal series 13, image 9), a focal chondrolabral ?junction tear. There is also mild degenerative blunting of the ?peripheral aspect of the anterior  superior left acetabular labrum. ?  ?Joint or bursal effusion ?  ?Joint  effusion:  No left hip joint effusion. ?  ?Bursae: There is mild-to-moderate fluid within the left trochanteric ?bursa with mild-to-moderate surrounding muscle edema. ?  ?Muscles and tendons ?  ?Muscles and tendons: The origins of the bilateral rectus femoris ?tendons and the bilateral common hamstring tendon origins are ?intact. There is mild-to-moderate fluid around the left gluteus ?minimus and anterior left gluteus medius tendon insertions, ?tenosynovitis. Possible tiny anterior left gluteus minimus ?partial-thickness insertional tear. More mild edema deep to the ?right gluteus minimus and anterior gluteus medius tendon insertions. ?The insertions of the bilateral iliopsoas tendons are intact. ?  ?Other findings ?  ?Miscellaneous: The intrapelvic soft tissues are grossly ?unremarkable. ?  ?IMPRESSION:: ?IMPRESSION: ?1. Mild-to-moderate anterior superior left femoral head and ?acetabular cartilage degenerative thinning. Likely degenerative ?nondisplaced anterior superior left chondrolabral junction tear. ?2. Mild-to-moderate left trochanteric bursitis. ?3. Mild-to-moderate left gluteus minimus and anterior gluteus medius ?insertional tenosynovitis. Mild right gluteus minimus and anterior ?gluteus medius insertional tenosynovitis. ?  ?  ?Electronically Signed ?  By: Neita Garnet M.D. ?  On: 09/11/2021 13:24 ?  ?I, Clementeen Graham, personally (independently) visualized and performed the interpretation of the images attached in this note. ? ? ? ? ? ?Impression and Recommendations:   ? ?Assessment and Plan: ?66 y.o. female with left hip pain mostly in the lateral to posterior hip.  She is already had a good trial of conservative management with trials of physical therapy in [place].  MRI shows tenosynovitis and bursitis at the greater trochanter and at the attachment of the gluteus medius and minimus tendons. ?This for the most part fits with her pain pattern.  I agree with Dr Jenna Day that an injection is warranted at this  point and have tried to match the area of maximum tenderness to her MRI and what I see on ultrasound.  There is a small bursa that is tender to palpation at the posterior superior portion of the greater trochanter which does fit with the insertion site of the gluteus medius and minimus tendons that looks like the dominant source of her pain.  She did have pretty good immediate improvement in pain after injection indicating that this is most likely her main pain generator.  Hopefully the steroid component would provide some good benefit.  I recommended continued home exercise program especially working on strengthening. ? ?Check back with Dr Jenna Day or myself as needed.  ?. ? ?PDMP not reviewed this encounter. ?Orders Placed This Encounter  ?Procedures  ? [place] LIMITED JOINT SPACE STRUCTURES LOW LEFT(NO LINKED CHARGES)  ?  Order Specific Question:   Reason for Exam (SYMPTOM  OR DIAGNOSIS REQUIRED)  ?  Answer:   left hip pain  ?  Order Specific Question:   Preferred imaging location?  ?  Answer:   Adult nurse [facility]  ? ?No orders of the defined types were placed in this encounter. ? ? ?Discussed warning signs or symptoms. Please see discharge instructions. Patient expresses understanding. ? ? ?The above documentation has been reviewed and is accurate and complete Clementeen Graham, M.D. ? ?

## 2021-12-25 ENCOUNTER — Other Ambulatory Visit: Payer: Self-pay | Admitting: Gynecology

## 2021-12-25 DIAGNOSIS — Z1231 Encounter for screening mammogram for malignant neoplasm of breast: Secondary | ICD-10-CM

## 2022-01-15 ENCOUNTER — Ambulatory Visit
Admission: RE | Admit: 2022-01-15 | Discharge: 2022-01-15 | Disposition: A | Discharge status: Home / Self Care | Payer: Medicare Other | Source: Ambulatory Visit | Attending: Gynecology | Admitting: Gynecology

## 2022-01-15 DIAGNOSIS — Z1231 Encounter for screening mammogram for malignant neoplasm of breast: Secondary | ICD-10-CM

## 2022-01-20 ENCOUNTER — Ambulatory Visit: Payer: Medicare Other

## 2022-12-22 ENCOUNTER — Other Ambulatory Visit: Payer: Self-pay | Admitting: Internal Medicine

## 2022-12-22 DIAGNOSIS — Z1231 Encounter for screening mammogram for malignant neoplasm of breast: Secondary | ICD-10-CM

## 2023-01-19 ENCOUNTER — Ambulatory Visit
Admission: RE | Admit: 2023-01-19 | Discharge: 2023-01-19 | Disposition: A | Discharge status: Home / Self Care | Payer: Medicare Other | Source: Ambulatory Visit | Attending: Internal Medicine | Admitting: Internal Medicine

## 2023-01-19 DIAGNOSIS — Z1231 Encounter for screening mammogram for malignant neoplasm of breast: Secondary | ICD-10-CM

## 2023-12-29 ENCOUNTER — Other Ambulatory Visit: Payer: Self-pay | Admitting: Gynecology

## 2023-12-29 DIAGNOSIS — Z1231 Encounter for screening mammogram for malignant neoplasm of breast: Secondary | ICD-10-CM

## 2024-01-20 ENCOUNTER — Ambulatory Visit
Admission: RE | Admit: 2024-01-20 | Discharge: 2024-01-20 | Disposition: A | Discharge status: Home / Self Care | Source: Ambulatory Visit | Attending: Gynecology | Admitting: Gynecology

## 2024-01-20 DIAGNOSIS — Z1231 Encounter for screening mammogram for malignant neoplasm of breast: Secondary | ICD-10-CM
